# Patient Record
Sex: Female | Born: 1990 | Race: White | Hispanic: No | Marital: Single | Smoking: Current every day smoker
Health system: Southern US, Community
[De-identification: ages and names within clinical notes are randomized; demographics above are authoritative.]

## PROBLEM LIST (undated history)

## (undated) ENCOUNTER — Inpatient Hospital Stay (HOSPITAL_COMMUNITY): Payer: Self-pay

## (undated) DIAGNOSIS — IMO0002 Reserved for concepts with insufficient information to code with codable children: Secondary | ICD-10-CM

## (undated) DIAGNOSIS — B1920 Unspecified viral hepatitis C without hepatic coma: Secondary | ICD-10-CM

## (undated) DIAGNOSIS — M3130 Wegener's granulomatosis without renal involvement: Secondary | ICD-10-CM

## (undated) HISTORY — DX: Reserved for concepts with insufficient information to code with codable children: IMO0002

## (undated) HISTORY — PX: APPENDECTOMY: SHX54

## (undated) HISTORY — PX: LUNG BIOPSY: SHX232

## (undated) HISTORY — PX: LEEP: SHX91

---

## 2010-09-01 ENCOUNTER — Ambulatory Visit (HOSPITAL_COMMUNITY)
Admission: RE | Admit: 2010-09-01 | Discharge: 2010-09-01 | Payer: Self-pay | Source: Home / Self Care | Attending: Obstetrics and Gynecology | Admitting: Obstetrics and Gynecology

## 2010-09-07 ENCOUNTER — Ambulatory Visit (HOSPITAL_COMMUNITY)
Admission: RE | Admit: 2010-09-07 | Discharge: 2010-09-07 | Payer: Self-pay | Source: Home / Self Care | Attending: Obstetrics and Gynecology | Admitting: Obstetrics and Gynecology

## 2010-10-06 DEATH — deceased

## 2010-11-15 LAB — CBC
Hemoglobin: 12.4 g/dL (ref 12.0–15.0)
MCHC: 33.8 g/dL (ref 30.0–36.0)
Platelets: 241 10*3/uL (ref 150–400)
RDW: 12.5 % (ref 11.5–15.5)

## 2010-11-15 LAB — ABO/RH: ABO/RH(D): A POS

## 2011-09-06 NOTE — L&D Delivery Note (Signed)
Delivery Note She progressed to complete and pushed well.  At 8:40 PM a viable female was delivered via Vaginal, Spontaneous Delivery (Presentation: Right Occiput Anterior).  APGAR: 9, 9; weight pending.   Placenta status: Intact, Spontaneous.  Cord:  with the following complications: None.   Anesthesia: Epidural  Episiotomy: None Lacerations: Bilateral Labial Suture Repair: none Est. Blood Loss (mL): 300  Mom to postpartum.  Baby to stay with mom.  Adeeb Konecny D 05/15/2012, 8:57 PM

## 2011-10-04 IMAGING — US US OB COMP LESS 14 WK
1 series · 14 of 28 positions shown · non-contrast
Comparison: none

[Series 1: us ob comp less 14 wks · 14 of 68 slices shown]
[im 3/68]
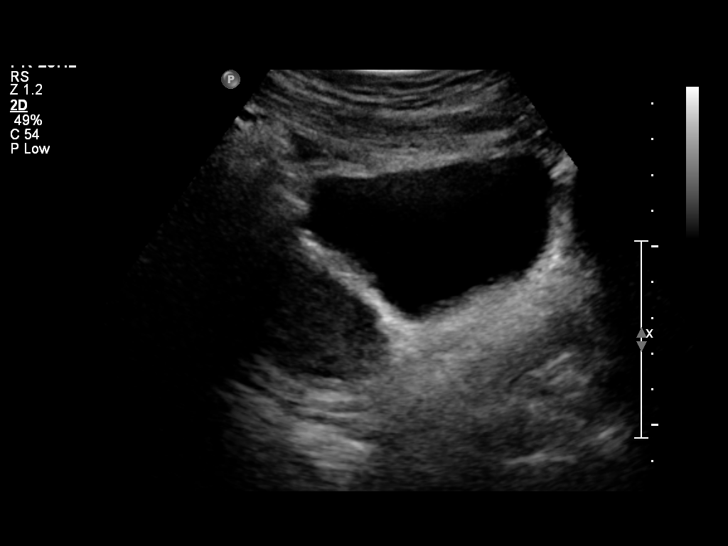
[im 8/68]
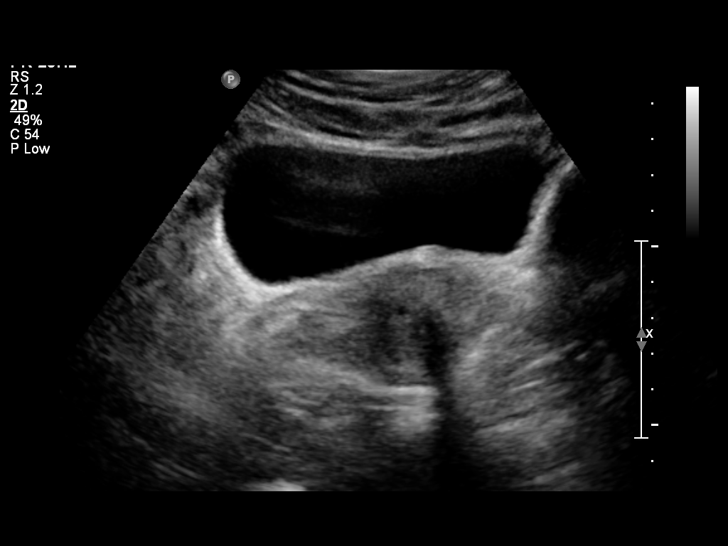
[im 13/68]
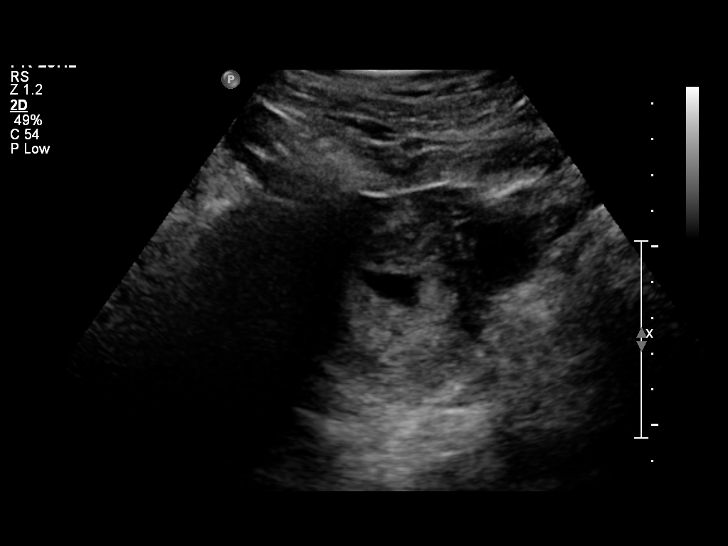
[im 18/68]
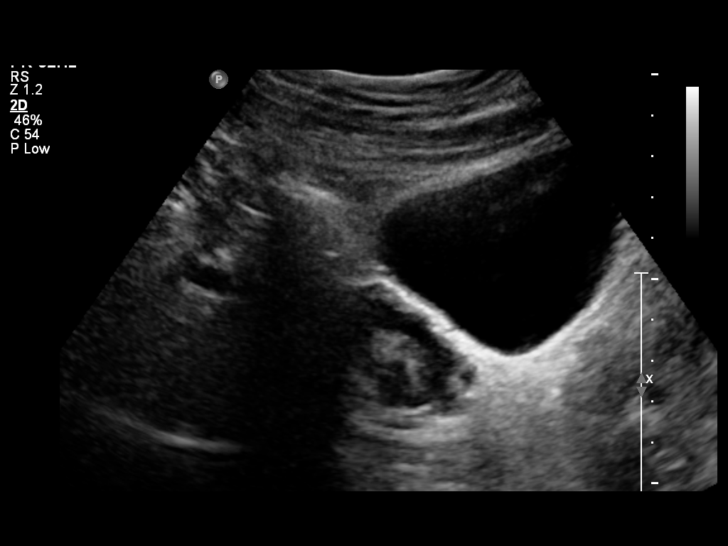
[im 23/68]
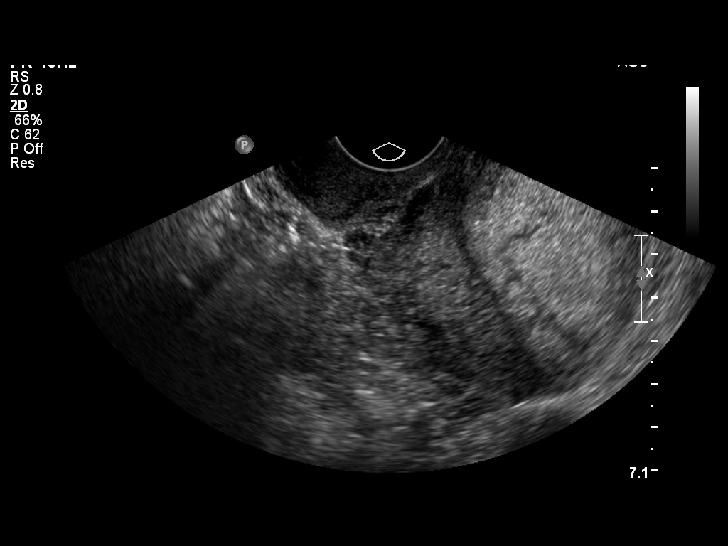
[im 28/68]
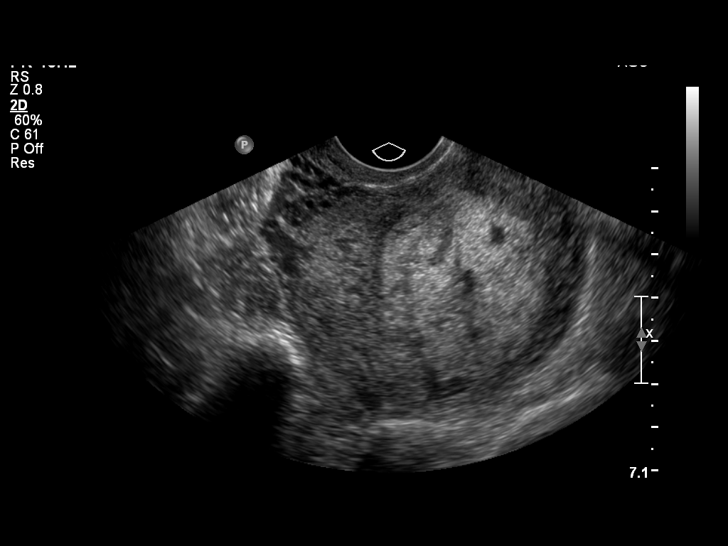
[im 33/68]
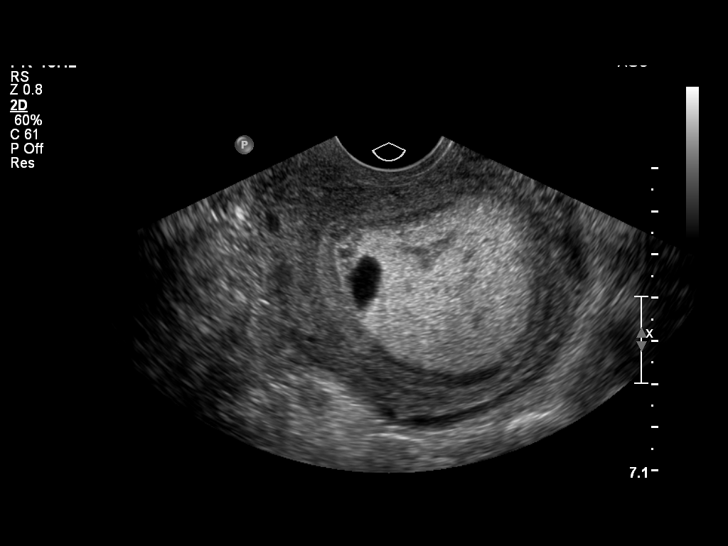
[im 38/68]
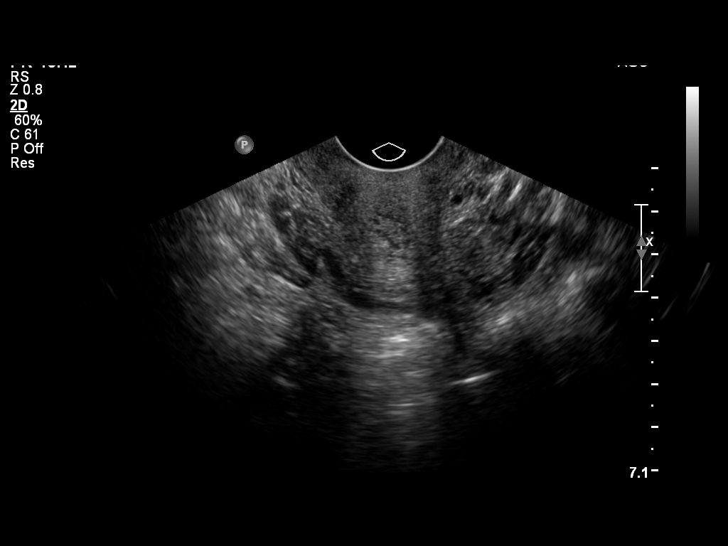
[im 43/68]
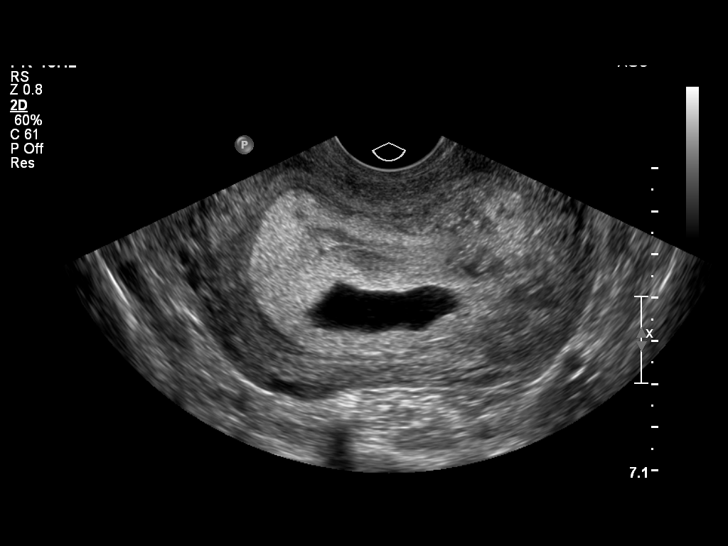
[im 48/68]
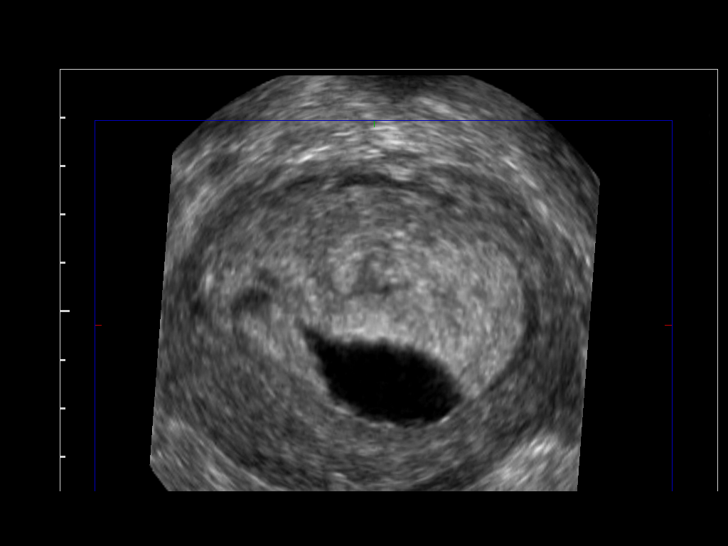
[im 53/68]
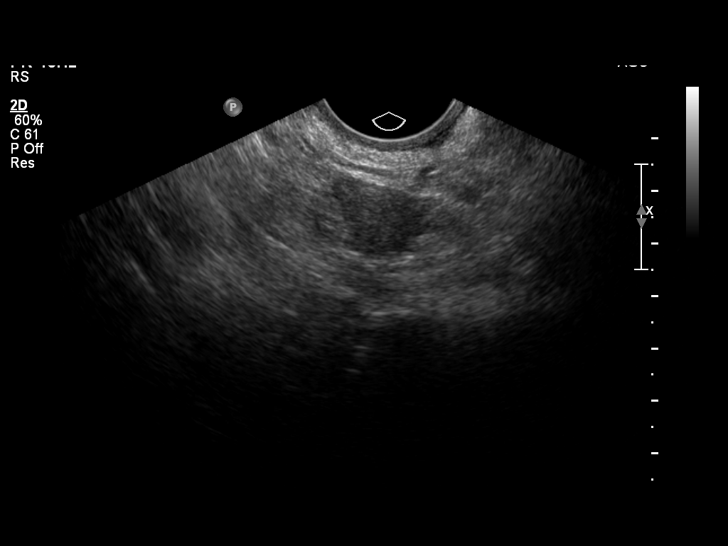
[im 58/68]
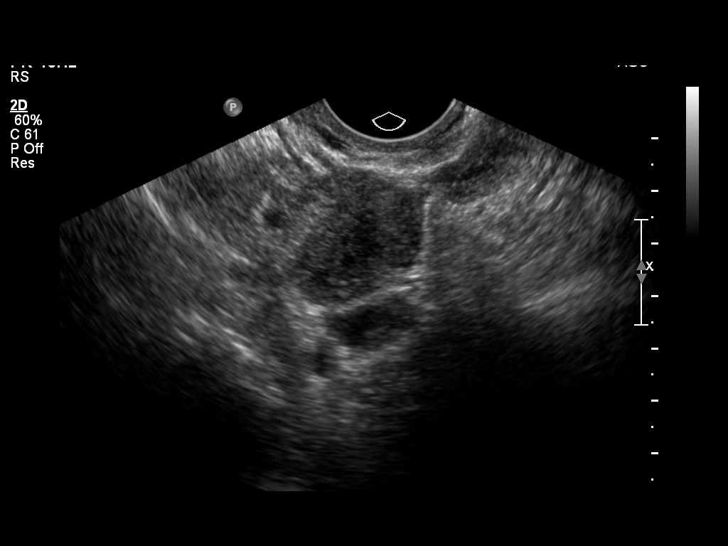
[im 63/68]
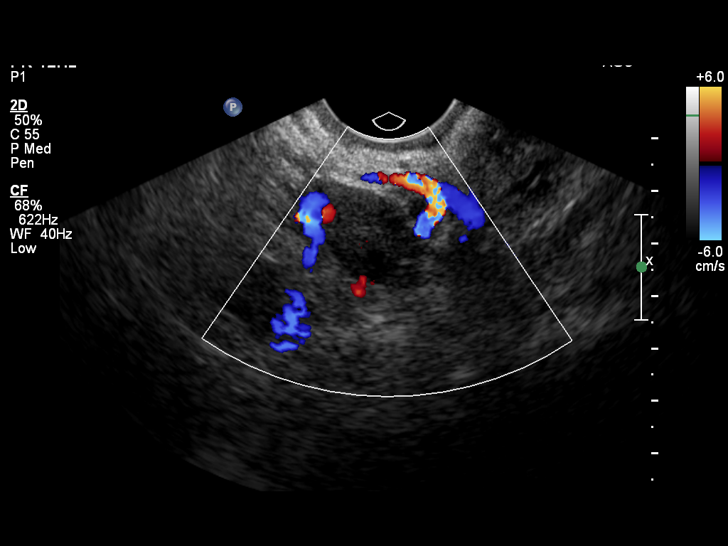
[im 68/68]
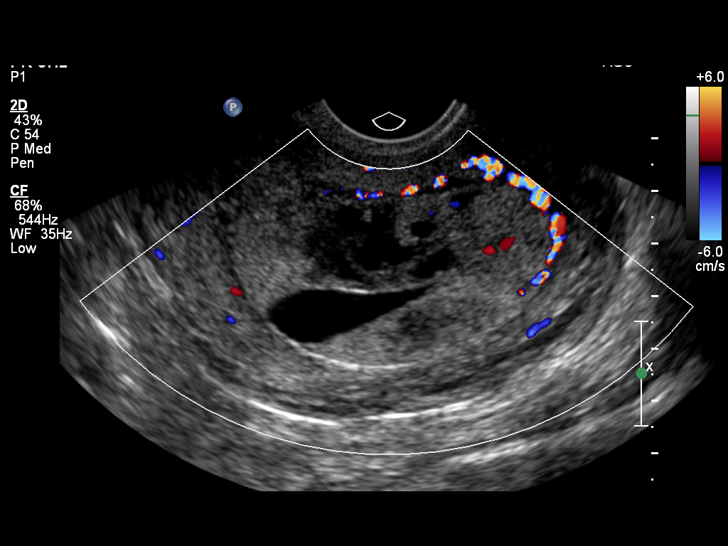

[14 of 28 positions shown; findings below may reference images not displayed]

OBSTETRICS REPORT
                      (Signed Final 09/01/2010 [DATE])

 Order#:         _O,_O
Procedures

 US OB COMP LESS 14 WKS                                76801.0
 US OB TRANSVAGINAL MODIFY                             76817.1
Indications

 Assess viability
Fetal Evaluation

 Preg. Location:    Intrauterine
 Gest. Sac:         Irregular
 Yolk Sac:          Not visualized
 Fetal Pole:        Not visualized
 Cardiac Activity:  No embryo visualized
Cervix Uterus Adnexa

 Uterus:       Retroverted.
 Cul De Sac:   No free fluid seen.

 Left Ovary:    Within normal limits.
 Right Ovary:   Within normal limits.
 Adnexa:     No abnormality visualized.
Impression

 Empty, irregular intrauterine GS, consistent with blighted
 ovum.  There is a prominent amount of acute and subacute
 subchorionic hemorrhage.  Molar pregnancy could have a
 similar appearance but is felt less likely; please correlate with
 Bhcg levels, however.

 questions or concerns.

## 2011-11-15 ENCOUNTER — Other Ambulatory Visit: Payer: Self-pay

## 2011-11-15 LAB — OB RESULTS CONSOLE ABO/RH

## 2011-11-15 LAB — OB RESULTS CONSOLE RUBELLA ANTIBODY, IGM: Rubella: UNDETERMINED

## 2011-11-15 LAB — OB RESULTS CONSOLE GC/CHLAMYDIA: Gonorrhea: NEGATIVE

## 2011-11-21 ENCOUNTER — Other Ambulatory Visit (HOSPITAL_COMMUNITY): Payer: Self-pay | Admitting: Obstetrics and Gynecology

## 2011-11-21 DIAGNOSIS — R772 Abnormality of alphafetoprotein: Secondary | ICD-10-CM

## 2011-11-21 DIAGNOSIS — Z3689 Encounter for other specified antenatal screening: Secondary | ICD-10-CM

## 2011-11-21 DIAGNOSIS — Z8679 Personal history of other diseases of the circulatory system: Secondary | ICD-10-CM

## 2011-11-23 ENCOUNTER — Encounter (HOSPITAL_COMMUNITY): Payer: Self-pay | Admitting: Obstetrics and Gynecology

## 2011-12-02 ENCOUNTER — Encounter (HOSPITAL_COMMUNITY): Payer: Self-pay

## 2011-12-02 ENCOUNTER — Ambulatory Visit (HOSPITAL_COMMUNITY)
Admission: RE | Admit: 2011-12-02 | Discharge: 2011-12-02 | Disposition: A | Discharge status: Home / Self Care | Payer: Medicaid Other | Source: Ambulatory Visit | Attending: Obstetrics and Gynecology | Admitting: Obstetrics and Gynecology

## 2011-12-02 VITALS — BP 114/64 | HR 89 | Wt 198.0 lb

## 2011-12-02 DIAGNOSIS — O358XX0 Maternal care for other (suspected) fetal abnormality and damage, not applicable or unspecified: Secondary | ICD-10-CM | POA: Insufficient documentation

## 2011-12-02 DIAGNOSIS — Z363 Encounter for antenatal screening for malformations: Secondary | ICD-10-CM | POA: Insufficient documentation

## 2011-12-02 DIAGNOSIS — R772 Abnormality of alphafetoprotein: Secondary | ICD-10-CM

## 2011-12-02 DIAGNOSIS — Z8679 Personal history of other diseases of the circulatory system: Secondary | ICD-10-CM

## 2011-12-02 DIAGNOSIS — Z3689 Encounter for other specified antenatal screening: Secondary | ICD-10-CM

## 2011-12-02 DIAGNOSIS — Z1389 Encounter for screening for other disorder: Secondary | ICD-10-CM | POA: Insufficient documentation

## 2011-12-02 NOTE — Progress Notes (Signed)
Genetic Counseling  High-Risk Gestation Note  Appointment Date:  12/02/2011 Referred By: Lavina Hamman, MD Date of Birth:  2004/01/28 Attending: Rica Koyanagi, MD    Kathy Payne and her partner, Mr. Lowella Petties, were seen for genetic counseling because of an increased risk for fetal Down syndrome based on Quad screening performed through [facility]. The patient was also accompanied by her mother.   They were counseled regarding the Quad screen result and the associated 1 in 129 risk for fetal Down syndrome.  We reviewed chromosomes, nondisjunction, and the common features and variable prognosis of Down syndrome.  In addition, we reviewed the screen negative risks for trisomy 18 and ONTDs (1 in 10,000).  We also discussed other explanations for a screen positive result including: a gestational dating error, differences in maternal metabolism, and normal variation.  We reviewed other available screening and diagnostic options including detailed ultrasound and amniocentesis.  We discussed the risks, limitations, and benefits of each. We discussed another type of screening test, noninvasive prenatal testing (NIPT), which utilizes cell free fetal DNA found in the maternal circulation. This test is not diagnostic for chromosome conditions, but can provide information regarding the presence or absence of extra fetal DNA for chromosomes 13, 18 and 21. Thus, it would not identify or rule out all fetal aneuploidy. The reported detection rate is greater than 99% for Trisomy 21, greater than 97% for Trisomy 18, and is approximately 80% (8 out of 10) for Trisomy 13. The false positive rate is reported to be less than 1% for any of these conditions.  After thoughtful consideration of these options, Kathy Payne elected to have ultrasound, but declined amniocentesis and cell free fetal DNA testing.  She understands that ultrasound cannot rule out all birth defects or genetic syndromes.  The patient was  advised of this limitation and states she still does not want diagnostic testing at this time.  However, they were counseled that 50-80% of fetuses with Down syndrome, when well visualized, have detectable anomalies or soft markers by ultrasound.  A complete ultrasound was performed today.  The ultrasound report will be sent under separate cover.   Kathy Payne was provided with written information regarding cystic fibrosis (CF) including the carrier frequency and incidence in the Caucasian population, the availability of carrier testing and prenatal diagnosis if indicated.  In addition, we discussed that CF is routinely screened for as part of the [place] newborn screening panel.  She declined testing today.   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 Luberta Robertson, pulmonologist at [facility]. We briefly discussed that the etiology of this condition is not currently known, but that it is not suspected to have a genetic cause.   Additionally, the father of the pregnancy's mother has multiple sclerosis. MS is generally thought to be a multifactorial condition, meaning that multiple genes and environmental factors are involved in its onset. The chance for recurrence for first degree relatives of an affected individual is estimated to be approximately 2-3% from birth and approximately 1% after age 57 years. Recurrence risk would be lower for a second degree relative. Without further information regarding the provided family history, an accurate genetic risk cannot be calculated. Further genetic counseling is warranted if more information is obtained.  Kathy Payne denied exposure to environmental toxins or chemical agents.  She denied the use of alcohol or street drugs. She reported smoking approximately a half pack of cigarettes per day. The  associations of smoking in pregnancy were reviewed and cessation encouraged.  She denied significant viral illnesses during the course of her pregnancy. Her medical and surgical histories were contributory for Wegener's Granulomatosis.   I counseled Kathy Payne for approximately 35 minutes regarding the above risks and available options.     Quinn Plowman, MS,  Certified Genetic Counselor  12/02/2011

## 2011-12-02 NOTE — Progress Notes (Signed)
Obstetric ultrasound performed today.    IUP at 17 weeks 4 days Fetal measurement consistent with dating by early ultrasound Normal fetal anatomic survey (some limited cardiac and spine views) No markers of aneuploidy identified Normal amniotic fluid volume  Ultrasound findings were discussed and the patient received genetic counseling.  She declined further testing for fetal aneuploidy.   Please see full report in [facility].

## 2011-12-02 NOTE — Progress Notes (Signed)
Encounter addended by: Marlana Latus, RN on: 12/02/2011  4:33 PM<BR>     Documentation filed: Chief Complaint Section, Episodes

## 2012-01-19 ENCOUNTER — Ambulatory Visit: Payer: Medicaid Other | Admitting: Gastroenterology

## 2012-01-20 ENCOUNTER — Ambulatory Visit (HOSPITAL_COMMUNITY)
Admission: RE | Admit: 2012-01-20 | Discharge: 2012-01-20 | Disposition: A | Discharge status: Home / Self Care | Payer: Medicaid Other | Source: Ambulatory Visit | Attending: Obstetrics and Gynecology | Admitting: Obstetrics and Gynecology

## 2012-01-20 DIAGNOSIS — Z3689 Encounter for other specified antenatal screening: Secondary | ICD-10-CM | POA: Insufficient documentation

## 2012-01-20 DIAGNOSIS — O3510X0 Maternal care for (suspected) chromosomal abnormality in fetus, unspecified, not applicable or unspecified: Secondary | ICD-10-CM | POA: Insufficient documentation

## 2012-01-20 DIAGNOSIS — O351XX0 Maternal care for (suspected) chromosomal abnormality in fetus, not applicable or unspecified: Secondary | ICD-10-CM | POA: Insufficient documentation

## 2012-01-20 DIAGNOSIS — R772 Abnormality of alphafetoprotein: Secondary | ICD-10-CM

## 2012-01-20 DIAGNOSIS — Z8679 Personal history of other diseases of the circulatory system: Secondary | ICD-10-CM

## 2012-01-26 ENCOUNTER — Ambulatory Visit: Payer: Medicaid Other | Admitting: Gastroenterology

## 2012-03-22 ENCOUNTER — Inpatient Hospital Stay (HOSPITAL_COMMUNITY)
Admission: AD | Admit: 2012-03-22 | Discharge: 2012-03-22 | Disposition: A | Discharge status: Home / Self Care | Payer: Medicaid Other | Source: Ambulatory Visit | Attending: Obstetrics and Gynecology | Admitting: Obstetrics and Gynecology

## 2012-03-22 ENCOUNTER — Encounter (HOSPITAL_COMMUNITY): Payer: Self-pay | Admitting: *Deleted

## 2012-03-22 DIAGNOSIS — O288 Other abnormal findings on antenatal screening of mother: Secondary | ICD-10-CM

## 2012-03-22 DIAGNOSIS — O99891 Other specified diseases and conditions complicating pregnancy: Secondary | ICD-10-CM | POA: Insufficient documentation

## 2012-03-22 DIAGNOSIS — O289 Unspecified abnormal findings on antenatal screening of mother: Secondary | ICD-10-CM | POA: Insufficient documentation

## 2012-03-22 HISTORY — DX: Wegener's granulomatosis without renal involvement: M31.30

## 2012-03-22 NOTE — MAU Provider Note (Signed)
Chief Complaint:  non-reactive trcing    First Provider Initiated Contact with Patient 03/22/12 1837      HPI  Kathy Payne is a 21 y.o. G2P0010 at [redacted]w[redacted]d sent to MAU from the office for extended monitoring. She has an NST for decreased FM. NST was non-reactive.  Denies contractions, leakage of fluid or vaginal bleeding. Good fetal movement. States "not feeling well today". Reports normal PO intake.  Past Medical History: Past Medical History  Diagnosis Date  . Wegener's granulomatosis     Past Surgical History: Past Surgical History  Procedure Date  . Appendectomy     Family History: History reviewed. No pertinent family history.  Social History: History  Substance Use Topics  . Smoking status: Current Everyday Smoker -- 0.5 packs/day    Types: Cigarettes  . Smokeless tobacco: Not on file  . Alcohol Use: No    Allergies: No Known Allergies  Meds:  Prescriptions prior to admission  Medication Sig Dispense Refill  . Prenatal Vit-Fe Fumarate-FA (PRENATAL MULTIVITAMIN) TABS Take 1 tablet by mouth daily.          Physical Exam  Blood pressure 99/58, pulse 80, temperature 97.5 F (36.4 C), temperature source Oral, resp. rate 18, last menstrual period 08/01/2011. GENERAL: Well-developed, well-nourished female in no acute distress.  HEENT: normocephalic, good dentition HEART: normal rate RESP: normal effort ABDOMEN: Soft, nontender, gravid appropriate for gestational age EXTREMITIES: Nontender, no edema NEURO: alert and oriented  SPECULUM EXAM: Deferred  FHT:  Baseline 115-120 , moderate variability, accelerations present, no decelerations Contractions: UI   Labs: NA  Imaging:  NA  Assessment: 1. Non-reactive NST (non-stress test), now reactive   Plan: D/C home per consult w/ Dr. Senaida Ores PTL precautions and FKCs Follow-up Information    Follow up with Oliver Pila, MD.   Contact information:   [address], [address] [place]  [place] [address] [phone]       Follow up with [facility]. (As needed if symptoms worsen)    Contact information:   [address] [place] [address] [phone]        Medication List  As of 03/28/2012  5:56 AM   CONTINUE taking these medications         prenatal multivitamin Tabs           Dorathy Kinsman 7/18/20136:43 PM

## 2012-03-22 NOTE — MAU Note (Signed)
reassurring but not reactive in office.  Sent for extended monitoring.

## 2012-03-22 NOTE — MAU Note (Signed)
Sent from office for NRNST.

## 2012-03-29 ENCOUNTER — Ambulatory Visit (INDEPENDENT_AMBULATORY_CARE_PROVIDER_SITE_OTHER): Payer: Medicaid Other | Admitting: Gastroenterology

## 2012-03-29 DIAGNOSIS — B182 Chronic viral hepatitis C: Secondary | ICD-10-CM

## 2012-03-29 LAB — CBC WITH DIFFERENTIAL/PLATELET
Eosinophils Absolute: 0.1 10*3/uL (ref 0.0–0.7)
Eosinophils Relative: 1 % (ref 0–5)
HCT: 32.3 % — ABNORMAL LOW (ref 36.0–46.0)
Lymphocytes Relative: 22 % (ref 12–46)
Lymphs Abs: 2.4 10*3/uL (ref 0.7–4.0)
Monocytes Absolute: 0.5 10*3/uL (ref 0.1–1.0)
RDW: 13.3 % (ref 11.5–15.5)
WBC: 11.1 10*3/uL — ABNORMAL HIGH (ref 4.0–10.5)

## 2012-03-29 LAB — PROTIME-INR: Prothrombin Time: 13.5 seconds (ref 11.6–15.2)

## 2012-03-30 LAB — COMPLETE METABOLIC PANEL WITH GFR
Albumin: 3.7 g/dL (ref 3.5–5.2)
Alkaline Phosphatase: 84 U/L (ref 39–117)
CO2: 20 mEq/L (ref 19–32)
Calcium: 8.7 mg/dL (ref 8.4–10.5)
Potassium: 4 mEq/L (ref 3.5–5.3)
Sodium: 137 mEq/L (ref 135–145)

## 2012-03-30 LAB — HEPATITIS A ANTIBODY, TOTAL: Hep A Total Ab: NEGATIVE

## 2012-03-30 LAB — IBC PANEL
%SAT: 19 % — ABNORMAL LOW (ref 20–55)
TIBC: 442 ug/dL (ref 250–470)

## 2012-03-30 LAB — ANA: Anti Nuclear Antibody(ANA): NEGATIVE

## 2012-03-30 LAB — HEPATITIS B CORE ANTIBODY, TOTAL: Hep B Core Total Ab: NEGATIVE

## 2012-03-30 LAB — FERRITIN: Ferritin: 55 ng/mL (ref 10–291)

## 2012-04-04 LAB — OB RESULTS CONSOLE GBS: GBS: POSITIVE

## 2012-04-05 NOTE — Progress Notes (Addendum)
Kathy Payne, Kathy Payne    MR#:  [id number]      DATE:  03/29/2012  DOB:  1990/09/11    cc:     referring physician:  Lavina Hamman, MD, [facility], [address] [place], [address], [place], [place] [address], fax [phone].  primary care physician:  None.  consulting physician:  Delaney Meigs. Luberta Robertson, MD, The Section of Pulmonary Critical Care Allergy and Immunologic Diseases, the Department of Medicine, [facility], [facility], [place], [place] [address].   REASON FOR REFERRAL: Positive hepatitis C antibody.   HISTORY: The patient is a 21 year old woman who I have been asked to see in consultation by Dr. Jackelyn Knife regarding a positive hepatitis C antibody.   According to the patient and her mother, who accompanied her today, there was no known history of hepatitis C until she was undergoing prenatal care at [facility]. She is currently [redacted] weeks pregnant with a date of 05/07/2012. On 11/15/2011 as part of routine testing she was hepatitis B surface antigen negative and hepatitis C antibody positive. Viral load was done 3,725,810 international units per mL or 6.571 international units per mL. She was not genotyped.   There are currently no symptoms referable to her history of hepatitis C nor symptoms to suggest cryoglobulin mediated or decompensated liver disease.   With respect to risk factors for liver disease, she reports only occasional use of alcohol in the past prior to pregnancy with no DWIs. 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 She has now been drug free for the last 10 months. She has a history of tattoos but no history of unsterile body piercing. In terms of transfusions, there is no history of blood transfusion prior to 1992. There is no family history of liver disease. I think she may have received hepatitis B  vaccination and possibly hepatitis A vaccine.   PAST MEDICAL HISTORY: Significant for Wegener's granulomatosis. She is followed by Dr. Luberta Robertson at [facility] for this. This was diagnosed by a lung biopsy in 2008 at [facility]. She has been treated with prednisone a combination of methotrexate, Cytoxan and plasmapheresis. She has been in remission off therapy since 2010. There was some indication of nasal involvement in the past as well as her lung involvement. Otherwise she has no significant past medical history including no history of diabetes, dyslipidemia, coronary artery disease, hypertension, or dysthyroidism.   PAST SURGICAL HISTORY: Lung biopsy and chest tube placement 2008 at [facility] and appendectomy.   PAST PSYCHIATRIC HISTORY: Denies.   CURRENT MEDICATIONS: Multivitamin only.   ALLERGIES: Denies.   HABITS: Smoking half pack of cigarettes per day. Alcohol as above.   FAMILY HISTORY: As above.   SOCIAL HISTORY: Single.  She is now pregnant with her first child at 34 weeks. She is currently living with a grandmother. The plan is to live with her mother after she delivers in [place], [place].   REVIEW OF SYSTEMS: All 10 systems reviewed today with the patient and they are negative other than which is mentioned above. CES-D was 5.   PHYSICAL EXAMINATION: Constitutional:  Well-appearing. Vital signs: Height 66 inches, weight 193 pounds, blood pressure 125/83, pulse 112, temperature of 97.8 Fahrenheit. Ears, nose, mouth and throat: Unremarkable oropharynx. No thyromegaly or neck masses. Chest:  Resonant to percussion. Clear to auscultation. Cardiovascular: Heart sounds normal S1, S2 without murmurs  or rubs. There is no peripheral edema. Abdominal exam: Normal bowel sounds. No masses or tenderness. There is a gravid uterus. I could not appreciate liver edge or spleen tip. Lymphatics: No cervical or inguinal lymphadenopathy. Central nervous system:  No asterixis or  focal neurologic findings. Dermatologic: Anicteric. No palmar erythema. Eyes: Anicteric sclera. Pupils were equal and reactive to light.   previous LABORATORIES: 11/15/2011 her platelet count was 249, hemoglobin 11, white count 6.4. Hepatitis B surface antigen was negative as previously mentioned. Her blood group was A positive. Her HIV was negative.  The HCV antibody was positive with a viral load of 3,725,810 international units per mL or 6.571 log international units per mL.   ASSESSMENT: The patient is a 21 year old woman with a history of a positive HCV RNA, genotype unknown, naive to treatment.   In terms of hepatitis C, there are several reasons not to treat her currently and possibly not treat her with interferon based therapies. First is that she is pregnant and ribavirin is a teratogen.  Second, interferon based therapies may exacerbate underlying autoimmune conditions, so I am concerned about the possibility of Wegener's granulomatosis flaring up with the use of pegylated interferon.  There are better therapies including non-interferon therapies that are expected by 2015 and certainly at her age it is unlikely that she would have cirrhosis from hepatitis C infection giving [place] time to wait for therapies with less side effects and better response rates.   In terms of the pregnancy, there is nothing that can be done to the hepatitis C that would modify the risk of transmission of hepatitis C to the child. Two well designed studies in the Journals and Annals of Internal Medicine suggest that the incidence of transmission is 5%, and transmission is much for efficient in the setting of HIV.   In my discussion today with the patient and her mother, who accompanied her, we discussed the diagnosis of hepatitis C and its significance. We discussed genotyping her. We discussed the role of a liver biopsy in genotype 1 versus waiting for the availability of fibroscan.  We discussed treatment with interferon  based therapies and waiting for non-interferon based therapies as well. I explained the rationale for waiting for non-interferon based therapies for her. We discussed the risk of contagion.   PLAN:  1. Standard labs.  2. Test for hepatitis A and B immunity.  3. Genotype.  4. Literature on hepatitis C given.  5. I will see her again in approximately 6-12 months' time in followup.  6. I will fax off a request for information from Dr. Luberta Robertson at [facility] regarding records regarding her treatment.               Brooke Dare, MD   ADDENDUM Genotype 3a   Hepatitis A and B nave.  04/26/12.  The only records I received from [facility] was an ER visit 10/10/09, for a URI.  [id number] .[id number]  D:  Thu Jul 25 20:37:59 2013 ; T:  Fri Jul 26 15:31:07 2013  Job #:  [id number]

## 2012-04-11 ENCOUNTER — Ambulatory Visit (HOSPITAL_COMMUNITY)
Admission: RE | Admit: 2012-04-11 | Discharge: 2012-04-11 | Disposition: A | Discharge status: Home / Self Care | Payer: Medicaid Other | Source: Ambulatory Visit | Attending: Obstetrics and Gynecology | Admitting: Obstetrics and Gynecology

## 2012-04-11 ENCOUNTER — Other Ambulatory Visit (HOSPITAL_COMMUNITY): Payer: Self-pay | Admitting: Obstetrics and Gynecology

## 2012-04-11 VITALS — BP 119/70 | HR 108 | Wt 203.5 lb

## 2012-04-11 DIAGNOSIS — O26849 Uterine size-date discrepancy, unspecified trimester: Secondary | ICD-10-CM

## 2012-04-11 DIAGNOSIS — O3510X0 Maternal care for (suspected) chromosomal abnormality in fetus, unspecified, not applicable or unspecified: Secondary | ICD-10-CM | POA: Insufficient documentation

## 2012-04-11 DIAGNOSIS — O365990 Maternal care for other known or suspected poor fetal growth, unspecified trimester, not applicable or unspecified: Secondary | ICD-10-CM | POA: Insufficient documentation

## 2012-04-11 DIAGNOSIS — O351XX0 Maternal care for (suspected) chromosomal abnormality in fetus, not applicable or unspecified: Secondary | ICD-10-CM | POA: Insufficient documentation

## 2012-04-11 NOTE — Progress Notes (Signed)
Patient seen today  for follow up ultrasound.  See full report in [facility].  Alpha Gula, MD  Single IUP at 36 2/7 weeks Overall fetal growth is appropriate (19th %tile).  The [facility] today measures at the 5th %tile. The long bones are somewhat shortened (all < 10th %tile) but appear morphologically normal Normal umbilical artery Doppler studies Normal amniotic fluid volume  Recommend 2x weekly NSTs, and follow up ultrasounds as clinically indicated

## 2012-04-12 ENCOUNTER — Other Ambulatory Visit: Payer: Self-pay

## 2012-05-07 ENCOUNTER — Inpatient Hospital Stay (HOSPITAL_COMMUNITY)
Admission: AD | Admit: 2012-05-07 | Discharge: 2012-05-07 | Disposition: A | Discharge status: Home / Self Care | Payer: Medicaid Other | Source: Ambulatory Visit | Attending: Obstetrics and Gynecology | Admitting: Obstetrics and Gynecology

## 2012-05-07 DIAGNOSIS — O368390 Maternal care for abnormalities of the fetal heart rate or rhythm, unspecified trimester, not applicable or unspecified: Secondary | ICD-10-CM | POA: Insufficient documentation

## 2012-05-07 NOTE — MAU Note (Signed)
Spoke with CN Heather in BS. Will triage in 173.

## 2012-05-08 ENCOUNTER — Telehealth (HOSPITAL_COMMUNITY): Payer: Self-pay | Admitting: *Deleted

## 2012-05-08 ENCOUNTER — Encounter (HOSPITAL_COMMUNITY): Payer: Self-pay | Admitting: *Deleted

## 2012-05-08 NOTE — Telephone Encounter (Signed)
Preadmission screen  

## 2012-05-14 ENCOUNTER — Encounter (HOSPITAL_COMMUNITY): Payer: Self-pay

## 2012-05-14 ENCOUNTER — Inpatient Hospital Stay (HOSPITAL_COMMUNITY)
Admission: RE | Admit: 2012-05-14 | Discharge: 2012-05-17 | DRG: 775 | Disposition: A | Discharge status: Home / Self Care | Payer: Medicaid Other | Source: Ambulatory Visit | Attending: Obstetrics and Gynecology | Admitting: Obstetrics and Gynecology

## 2012-05-14 DIAGNOSIS — B1920 Unspecified viral hepatitis C without hepatic coma: Secondary | ICD-10-CM | POA: Diagnosis present

## 2012-05-14 DIAGNOSIS — O365990 Maternal care for other known or suspected poor fetal growth, unspecified trimester, not applicable or unspecified: Secondary | ICD-10-CM | POA: Diagnosis present

## 2012-05-14 DIAGNOSIS — Z3490 Encounter for supervision of normal pregnancy, unspecified, unspecified trimester: Secondary | ICD-10-CM

## 2012-05-14 DIAGNOSIS — O99892 Other specified diseases and conditions complicating childbirth: Secondary | ICD-10-CM | POA: Diagnosis present

## 2012-05-14 DIAGNOSIS — Z22330 Carrier of Group B streptococcus: Secondary | ICD-10-CM

## 2012-05-14 DIAGNOSIS — O26619 Liver and biliary tract disorders in pregnancy, unspecified trimester: Principal | ICD-10-CM | POA: Diagnosis present

## 2012-05-14 HISTORY — DX: Unspecified viral hepatitis C without hepatic coma: B19.20

## 2012-05-14 LAB — CBC
Hemoglobin: 11.1 g/dL — ABNORMAL LOW (ref 12.0–15.0)
MCH: 33.2 pg (ref 26.0–34.0)
MCHC: 35.4 g/dL (ref 30.0–36.0)
MCV: 94 fL (ref 78.0–100.0)
RBC: 3.34 MIL/uL — ABNORMAL LOW (ref 3.87–5.11)

## 2012-05-14 MED ORDER — IBUPROFEN 600 MG PO TABS
600.0000 mg | ORAL_TABLET | Freq: Four times a day (QID) | ORAL | Status: DC | PRN
  Administered 2012-05-15: 600 mg via ORAL
  Filled 2012-05-14: qty 1

## 2012-05-14 MED ORDER — CITRIC ACID-SODIUM CITRATE 334-500 MG/5ML PO SOLN
30.0000 mL | ORAL | Status: DC | PRN
  Administered 2012-05-15: 30 mL via ORAL
  Filled 2012-05-14: qty 15

## 2012-05-14 MED ORDER — LACTATED RINGERS IV SOLN
500.0000 mL | INTRAVENOUS | Status: DC | PRN
  Administered 2012-05-15: 500 mL via INTRAVENOUS

## 2012-05-14 MED ORDER — ONDANSETRON HCL 4 MG/2ML IJ SOLN: 4.0000 mg | Freq: Four times a day (QID) | INTRAMUSCULAR | Status: DC | PRN

## 2012-05-14 MED ORDER — TERBUTALINE SULFATE 1 MG/ML IJ SOLN: 0.2500 mg | Freq: Once | INTRAMUSCULAR | Status: AC | PRN

## 2012-05-14 MED ORDER — ZOLPIDEM TARTRATE 5 MG PO TABS: 5.0000 mg | ORAL_TABLET | Freq: Every evening | ORAL | Status: DC | PRN

## 2012-05-14 MED ORDER — PENICILLIN G POTASSIUM 5000000 UNITS IJ SOLR
5.0000 10*6.[IU] | Freq: Once | INTRAVENOUS | Status: AC
  Administered 2012-05-14: 5 10*6.[IU] via INTRAVENOUS
  Filled 2012-05-14: qty 5

## 2012-05-14 MED ORDER — LIDOCAINE HCL (PF) 1 % IJ SOLN
30.0000 mL | INTRAMUSCULAR | Status: DC | PRN
  Filled 2012-05-14: qty 30

## 2012-05-14 MED ORDER — OXYTOCIN BOLUS FROM INFUSION
500.0000 mL | Freq: Once | INTRAVENOUS | Status: AC
  Administered 2012-05-15: 500 mL via INTRAVENOUS
  Filled 2012-05-14: qty 500

## 2012-05-14 MED ORDER — ACETAMINOPHEN 325 MG PO TABS: 650.0000 mg | ORAL_TABLET | ORAL | Status: DC | PRN

## 2012-05-14 MED ORDER — MISOPROSTOL 25 MCG QUARTER TABLET
25.0000 ug | ORAL_TABLET | ORAL | Status: DC | PRN
  Administered 2012-05-14: 25 ug via VAGINAL
  Filled 2012-05-14 (×2): qty 0.25

## 2012-05-14 MED ORDER — LACTATED RINGERS IV SOLN
INTRAVENOUS | Status: DC
  Administered 2012-05-14: 21:00:00 via INTRAVENOUS
  Administered 2012-05-15: 125 mL via INTRAVENOUS
  Administered 2012-05-15: 14:00:00 via INTRAVENOUS

## 2012-05-14 MED ORDER — OXYTOCIN 40 UNITS IN LACTATED RINGERS INFUSION - SIMPLE MED
62.5000 mL/h | Freq: Once | INTRAVENOUS | Status: AC
  Administered 2012-05-15: 62.5 mL/h via INTRAVENOUS

## 2012-05-14 MED ORDER — PENICILLIN G POTASSIUM 5000000 UNITS IJ SOLR
2.5000 10*6.[IU] | INTRAVENOUS | Status: DC
  Administered 2012-05-15 (×5): 2.5 10*6.[IU] via INTRAVENOUS
  Filled 2012-05-14 (×8): qty 2.5

## 2012-05-14 MED ORDER — OXYCODONE-ACETAMINOPHEN 5-325 MG PO TABS: 1.0000 | ORAL_TABLET | ORAL | Status: DC | PRN

## 2012-05-15 ENCOUNTER — Encounter (HOSPITAL_COMMUNITY): Payer: Self-pay | Admitting: Anesthesiology

## 2012-05-15 ENCOUNTER — Encounter (HOSPITAL_COMMUNITY): Payer: Self-pay

## 2012-05-15 ENCOUNTER — Inpatient Hospital Stay (HOSPITAL_COMMUNITY): Payer: Medicaid Other | Admitting: Anesthesiology

## 2012-05-15 DIAGNOSIS — B1920 Unspecified viral hepatitis C without hepatic coma: Secondary | ICD-10-CM | POA: Diagnosis present

## 2012-05-15 DIAGNOSIS — Z3490 Encounter for supervision of normal pregnancy, unspecified, unspecified trimester: Secondary | ICD-10-CM

## 2012-05-15 LAB — RPR: RPR Ser Ql: NONREACTIVE

## 2012-05-15 MED ORDER — OXYTOCIN 40 UNITS IN LACTATED RINGERS INFUSION - SIMPLE MED
1.0000 m[IU]/min | INTRAVENOUS | Status: DC
  Administered 2012-05-15: 2 m[IU]/min via INTRAVENOUS
  Filled 2012-05-15: qty 1000

## 2012-05-15 MED ORDER — LIDOCAINE HCL (PF) 1 % IJ SOLN
INTRAMUSCULAR | Status: DC | PRN
  Administered 2012-05-15 (×2): 4 mL

## 2012-05-15 MED ORDER — PHENYLEPHRINE 40 MCG/ML (10ML) SYRINGE FOR IV PUSH (FOR BLOOD PRESSURE SUPPORT): 80.0000 ug | PREFILLED_SYRINGE | INTRAVENOUS | Status: DC | PRN

## 2012-05-15 MED ORDER — LACTATED RINGERS IV SOLN
500.0000 mL | Freq: Once | INTRAVENOUS | Status: AC
  Administered 2012-05-15: 500 mL via INTRAVENOUS

## 2012-05-15 MED ORDER — PHENYLEPHRINE 40 MCG/ML (10ML) SYRINGE FOR IV PUSH (FOR BLOOD PRESSURE SUPPORT)
80.0000 ug | PREFILLED_SYRINGE | INTRAVENOUS | Status: DC | PRN
  Filled 2012-05-15: qty 5

## 2012-05-15 MED ORDER — EPHEDRINE 5 MG/ML INJ: 10.0000 mg | INTRAVENOUS | Status: DC | PRN

## 2012-05-15 MED ORDER — FENTANYL 2.5 MCG/ML BUPIVACAINE 1/10 % EPIDURAL INFUSION (WH - ANES)
14.0000 mL/h | INTRAMUSCULAR | Status: DC
Start: 2012-05-15 — End: 2012-05-15
  Administered 2012-05-15: 14 mL/h via EPIDURAL
  Filled 2012-05-15 (×2): qty 60

## 2012-05-15 MED ORDER — DIPHENHYDRAMINE HCL 50 MG/ML IJ SOLN: 12.5000 mg | INTRAMUSCULAR | Status: DC | PRN

## 2012-05-15 MED ORDER — FENTANYL 2.5 MCG/ML BUPIVACAINE 1/10 % EPIDURAL INFUSION (WH - ANES)
INTRAMUSCULAR | Status: DC | PRN
  Administered 2012-05-15: 14 mL/h via EPIDURAL

## 2012-05-15 MED ORDER — BUTORPHANOL TARTRATE 1 MG/ML IJ SOLN
2.0000 mg | Freq: Once | INTRAMUSCULAR | Status: AC
  Administered 2012-05-15: 2 mg via INTRAVENOUS
  Filled 2012-05-15: qty 2

## 2012-05-15 MED ORDER — EPHEDRINE 5 MG/ML INJ
10.0000 mg | INTRAVENOUS | Status: DC | PRN
  Filled 2012-05-15: qty 4

## 2012-05-15 NOTE — Progress Notes (Signed)
Comfortable Afeb, VSS FHT- Cat II, min-mod variability, some early and some late decels, ctx q 3 min VE- 6/90/0, vtx Continue pitocin and PCN, monitor progress and FHT

## 2012-05-15 NOTE — Anesthesia Procedure Notes (Signed)
Epidural Patient location during procedure: OB Start time: 05/15/2012 1:12 PM  Staffing Anesthesiologist: Aamiyah Derrick A. Performed by: anesthesiologist   Preanesthetic Checklist Completed: patient identified, site marked, surgical consent, pre-op evaluation, timeout performed, IV checked, risks and benefits discussed and monitors and equipment checked  Epidural Patient position: sitting Prep: site prepped and draped and DuraPrep Patient monitoring: continuous pulse ox and blood pressure Approach: midline Injection technique: LOR air  Needle:  Needle type: Tuohy  Needle gauge: 17 G Needle length: 9 cm and 9 Needle insertion depth: 6 cm Catheter type: closed end flexible Catheter size: 19 Gauge Catheter at skin depth: 11 cm Test dose: negative and Other  Assessment Events: blood not aspirated, injection not painful, no injection resistance, negative IV test and no paresthesia  Additional Notes Patient identified. Risks and benefits discussed including failed block, incomplete  Pain control, post dural puncture headache, nerve damage, paralysis, blood pressure Changes, nausea, vomiting, reactions to medications-both toxic and allergic and post Partum back pain. All questions were answered. Patient expressed understanding and wished to proceed. Sterile technique was used throughout procedure. Epidural site was Dressed with sterile barrier dressing. No paresthesias, signs of intravascular injection Or signs of intrathecal spread were encountered.  Patient was more comfortable after the epidural was dosed. Please see RN's note for documentation of vital signs and FHR which are stable.

## 2012-05-15 NOTE — Anesthesia Preprocedure Evaluation (Signed)
Anesthesia Evaluation  Patient identified by MRN, date of birth, ID band Patient awake    Reviewed: Allergy & Precautions, H&P , Patient's Chart, lab work & pertinent test results  Airway Mallampati: III TM Distance: >3 FB Neck ROM: full    Dental No notable dental hx. (+) Teeth Intact   Pulmonary Current Smoker,  breath sounds clear to auscultation  Pulmonary exam normal       Cardiovascular negative cardio ROS  Rhythm:regular Rate:Normal     Neuro/Psych negative neurological ROS  negative psych ROS   GI/Hepatic negative GI ROS, (+) Hepatitis -, CAsymptomatic, discovered during pregnancy   Endo/Other  negative endocrine ROS  Renal/GU negative Renal ROS  negative genitourinary   Musculoskeletal negative musculoskeletal ROS (+)   Abdominal Normal abdominal exam  (+)   Peds  Hematology negative hematology ROS (+)   Anesthesia Other Findings   Reproductive/Obstetrics (+) Pregnancy                           Anesthesia Physical Anesthesia Plan  ASA: II  Anesthesia Plan: Epidural   Post-op Pain Management:    Induction:   Airway Management Planned:   Additional Equipment:   Intra-op Plan:   Post-operative Plan:   Informed Consent: I have reviewed the patients History and Physical, chart, labs and discussed the procedure including the risks, benefits and alternatives for the proposed anesthesia with the patient or authorized representative who has indicated his/her understanding and acceptance.     Plan Discussed with: Anesthesiologist  Anesthesia Plan Comments:         Anesthesia Quick Evaluation

## 2012-05-15 NOTE — H&P (Signed)
Kathy Payne is a 21 y.o. female, G2 P0010, EGA 41+ weeks with EDC 9-2 presenting for cervical ripening and induction.  Prenatal care complicated by the discovery the she is HCV +, 1:129 risk Trisomy 21 with nl u/s and no other testing, recent S<D with small AC, reactive NSTs.  Please see prenatal records for complete history.  She received one dose of Cytotec late last pm, did not receive a second dose due to some late decelerations.  Maternal Medical History:  Fetal activity: Perceived fetal activity is normal.      OB History    Grav Para Term Preterm Abortions TAB SAB Ect Mult Living   2 0 0 0 1 1 0 0 0 0      Past Medical History  Diagnosis Date  . Wegener's granulomatosis     has had plasma pheresis  . Abnormal Pap smear   Hepatitis C  Past Surgical History  Procedure Date  . Appendectomy   . Leep   . Lung biopsy    Family History: family history is not on file. Social History:  reports that she has been smoking Cigarettes.  She has been smoking about .5 packs per day. She has never used smokeless tobacco. She reports that she does not drink alcohol or use illicit drugs.   Prenatal Transfer Tool  Maternal Diabetes: No Genetic Screening: Abnormal:  Results: Elevated risk of Trisomy 21 Maternal Ultrasounds/Referrals: Abnormal:  Findings:   Other:SGA with small AC Fetal Ultrasounds or other Referrals:  None Maternal Substance Abuse:  Yes:  Type: Smoker Significant Maternal Medications:  None Significant Maternal Lab Results:  Lab values include: Group B Strep positive, Other: HCV Other Comments:  1:129 risk Trisomy 1, mom +HCV  Review of Systems  Respiratory: Negative.   Cardiovascular: Negative.     Dilation: 1 Effacement (%): 60 Station: -2 Exam by:: Dr. Jackelyn Knife Blood pressure 121/73, pulse 100, temperature 97.6 F (36.4 C), temperature source Oral, resp. rate 18, height 5\' 6"  (1.676 m), weight 93.441 kg (206 lb), last menstrual period 08/01/2011. Maternal  Exam:  Uterine Assessment: Contraction strength is moderate.  Contraction frequency is irregular.   Abdomen: Patient reports no abdominal tenderness. Estimated fetal weight is 6 lbs.   Fetal presentation: vertex  Introitus: Normal vulva. Normal vagina.  Pelvis: adequate for delivery.   Cervix: Cervix evaluated by digital exam.     Fetal Exam Fetal Monitor Review: Mode: ultrasound.   Baseline rate: 130.  Variability: moderate (6-25 bpm).   Pattern: accelerations present and no decelerations.    Fetal State Assessment: Category I - tracings are normal.     Physical Exam  Constitutional: She appears well-developed and well-nourished.  Cardiovascular: Normal rate, regular rhythm and normal heart sounds.   No murmur heard. Respiratory: Effort normal and breath sounds normal. No respiratory distress. She has no wheezes.  GI: Soft.       gravid    Prenatal labs: ABO, Rh: A/Positive/-- (03/12 0000) Antibody:  neg Rubella: Equivocal (03/12 0000) RPR: NON REACTIVE (09/09 2025)  HBsAg: Negative (03/12 0000)  HIV: Non-reactive (03/12 0000)  GBS: Positive (07/31 0000)  GCT:  55  Assessment/Plan: IUP at 41 weeks with SGA and small AC, +HCV, +GBS, elevated risk for Trisomy 21.  Admitted last pm for ripening, received one dose of Cytotec.  Exam improved this am, will start pitocin, continue PCN for +GBS.     Kathy Payne D 05/15/2012, 8:03 AM

## 2012-05-15 NOTE — Progress Notes (Signed)
Comfortable with epidural Afeb, VSS FHT- Cat II, mod variability, early decels, ctx q 3 min VE- 3/90/-1, vtx, AROM- clear Continue pitocin and monitor progress

## 2012-05-16 MED ORDER — INFLUENZA VIRUS VACC SPLIT PF IM SUSP
0.5000 mL | INTRAMUSCULAR | Status: AC
  Administered 2012-05-17: 0.5 mL via INTRAMUSCULAR
  Filled 2012-05-16: qty 0.5

## 2012-05-16 MED ORDER — MAGNESIUM HYDROXIDE 400 MG/5ML PO SUSP: 30.0000 mL | ORAL | Status: DC | PRN

## 2012-05-16 MED ORDER — OXYCODONE-ACETAMINOPHEN 5-325 MG PO TABS
1.0000 | ORAL_TABLET | ORAL | Status: DC | PRN
  Administered 2012-05-16 (×3): 1 via ORAL
  Filled 2012-05-16 (×3): qty 1

## 2012-05-16 MED ORDER — METHYLERGONOVINE MALEATE 0.2 MG/ML IJ SOLN: 0.2000 mg | INTRAMUSCULAR | Status: DC | PRN

## 2012-05-16 MED ORDER — SENNOSIDES-DOCUSATE SODIUM 8.6-50 MG PO TABS
2.0000 | ORAL_TABLET | Freq: Every day | ORAL | Status: DC
  Administered 2012-05-16: 2 via ORAL

## 2012-05-16 MED ORDER — TETANUS-DIPHTH-ACELL PERTUSSIS 5-2.5-18.5 LF-MCG/0.5 IM SUSP: 0.5000 mL | Freq: Once | INTRAMUSCULAR | Status: DC

## 2012-05-16 MED ORDER — DIBUCAINE 1 % RE OINT: 1.0000 "application " | TOPICAL_OINTMENT | RECTAL | Status: DC | PRN

## 2012-05-16 MED ORDER — PRENATAL MULTIVITAMIN CH
1.0000 | ORAL_TABLET | Freq: Every day | ORAL | Status: DC
  Administered 2012-05-16 – 2012-05-17 (×2): 1 via ORAL
  Filled 2012-05-16 (×2): qty 1

## 2012-05-16 MED ORDER — LANOLIN HYDROUS EX OINT: TOPICAL_OINTMENT | CUTANEOUS | Status: DC | PRN

## 2012-05-16 MED ORDER — ONDANSETRON HCL 4 MG/2ML IJ SOLN: 4.0000 mg | INTRAMUSCULAR | Status: DC | PRN

## 2012-05-16 MED ORDER — METHYLERGONOVINE MALEATE 0.2 MG PO TABS: 0.2000 mg | ORAL_TABLET | ORAL | Status: DC | PRN

## 2012-05-16 MED ORDER — DIPHENHYDRAMINE HCL 25 MG PO CAPS: 25.0000 mg | ORAL_CAPSULE | Freq: Four times a day (QID) | ORAL | Status: DC | PRN

## 2012-05-16 MED ORDER — MEASLES, MUMPS & RUBELLA VAC ~~LOC~~ INJ
0.5000 mL | INJECTION | Freq: Once | SUBCUTANEOUS | Status: AC
  Administered 2012-05-17: 0.5 mL via SUBCUTANEOUS
  Filled 2012-05-16 (×2): qty 0.5

## 2012-05-16 MED ORDER — WITCH HAZEL-GLYCERIN EX PADS: 1.0000 "application " | MEDICATED_PAD | CUTANEOUS | Status: DC | PRN

## 2012-05-16 MED ORDER — SIMETHICONE 80 MG PO CHEW: 80.0000 mg | CHEWABLE_TABLET | ORAL | Status: DC | PRN

## 2012-05-16 MED ORDER — IBUPROFEN 600 MG PO TABS
600.0000 mg | ORAL_TABLET | Freq: Four times a day (QID) | ORAL | Status: DC
  Administered 2012-05-16 – 2012-05-17 (×6): 600 mg via ORAL
  Filled 2012-05-16 (×6): qty 1

## 2012-05-16 MED ORDER — ZOLPIDEM TARTRATE 5 MG PO TABS: 5.0000 mg | ORAL_TABLET | Freq: Every evening | ORAL | Status: DC | PRN

## 2012-05-16 MED ORDER — BENZOCAINE-MENTHOL 20-0.5 % EX AERO: 1.0000 "application " | INHALATION_SPRAY | CUTANEOUS | Status: DC | PRN

## 2012-05-16 MED ORDER — ONDANSETRON HCL 4 MG PO TABS: 4.0000 mg | ORAL_TABLET | ORAL | Status: DC | PRN

## 2012-05-16 NOTE — Progress Notes (Signed)
UR chart review completed.  

## 2012-05-16 NOTE — Anesthesia Postprocedure Evaluation (Signed)
  Anesthesia Post Note  Patient: Kathy Payne  Procedure(s) Performed: * No procedures listed *  Anesthesia type: Epidural  Patient location: Mother/Baby  Post pain: Pain level controlled  Post assessment: Post-op Vital signs reviewed  Last Vitals:  Filed Vitals:   05/16/12 0400  BP: 101/61  Pulse: 63  Temp: 36.9 C  Resp: 18    Post vital signs: Reviewed  Level of consciousness:alert  Complications: No apparent anesthesia complications

## 2012-05-16 NOTE — Progress Notes (Signed)
PPD #1 No problems Afeb, VSS Fundus firm, NT at U-1 Continue routine postpartum care 

## 2012-05-17 MED ORDER — OXYCODONE-ACETAMINOPHEN 5-325 MG PO TABS: 1.0000 | ORAL_TABLET | ORAL | Status: AC | PRN

## 2012-05-17 MED ORDER — IBUPROFEN 600 MG PO TABS: 600.0000 mg | ORAL_TABLET | Freq: Four times a day (QID) | ORAL | Status: AC

## 2012-05-17 NOTE — Progress Notes (Signed)
PPD #2 No problems Afeb, VSS Fundus firm at U-2 D/c home

## 2012-05-17 NOTE — Discharge Summary (Signed)
Obstetric Discharge Summary Reason for Admission: induction of labor Prenatal Procedures: NST Intrapartum Procedures: spontaneous vaginal delivery Postpartum Procedures: none Complications-Operative and Postpartum: none Hemoglobin  Date Value Range Status  05/14/2012 11.1* 12.0 - 15.0 g/dL Final     HCT  Date Value Range Status  05/14/2012 31.4* 36.0 - 46.0 % Final    Physical Exam:  General: alert Lochia: appropriate Uterine Fundus: firm  Discharge Diagnoses: Term Pregnancy-delivered and Hepatitis C  Discharge Information: Date: 05/17/2012 Activity: pelvic rest Diet: routine Medications: Ibuprofen and Percocet Condition: stable Instructions: refer to practice specific booklet Discharge to: home Follow-up Information    Follow up with Aeris Hersman D, MD. Schedule an appointment as soon as possible for a visit in 6 weeks. (Call Monday to schedule circumcision)    Contact information:   [address] [place] [address] [phone]          Newborn Data: Live born female  Birth Weight: 5 lb 10 oz (2551 g) APGAR: 9, 9  Home with mother.  Kathy Payne D 05/17/2012, 7:49 AM

## 2012-05-17 NOTE — Progress Notes (Signed)
Sw referral received to assess history of IV drug use. Pt told Sw that she hasn't used in over 1 1/2 years ago. She did not name a specific drug of choice, as she stated, "it just had to be IV." She did not seek treatment, rather stopped on her on. She lives with her mother, Phyllis [address]. She is unsure if FOB, Zachary Kennedy, will be involved. Pt's boyfriend is not the FOB. She has supplies for the infant and appears appropriate. UDS & meconium samples were not collected.      

## 2014-07-07 ENCOUNTER — Encounter (HOSPITAL_COMMUNITY): Payer: Self-pay

## 2016-12-09 ENCOUNTER — Encounter (HOSPITAL_COMMUNITY): Payer: Self-pay | Admitting: Emergency Medicine

## 2016-12-09 ENCOUNTER — Emergency Department (HOSPITAL_COMMUNITY)
Admission: EM | Admit: 2016-12-09 | Discharge: 2016-12-09 | Disposition: A | Discharge status: Home / Self Care | Payer: Medicaid Other | Attending: Emergency Medicine | Admitting: Emergency Medicine

## 2016-12-09 DIAGNOSIS — F17210 Nicotine dependence, cigarettes, uncomplicated: Secondary | ICD-10-CM | POA: Diagnosis not present

## 2016-12-09 DIAGNOSIS — L02416 Cutaneous abscess of left lower limb: Secondary | ICD-10-CM | POA: Diagnosis present

## 2016-12-09 MED ORDER — DICLOFENAC SODIUM 50 MG PO TBEC: 50.0000 mg | DELAYED_RELEASE_TABLET | Freq: Two times a day (BID) | ORAL | 0 refills | Status: AC

## 2016-12-09 MED ORDER — DOXYCYCLINE HYCLATE 100 MG PO CAPS: 100.0000 mg | ORAL_CAPSULE | Freq: Two times a day (BID) | ORAL | 0 refills | Status: AC

## 2016-12-09 NOTE — ED Notes (Signed)
Pt verbalized understanding discharge instructions and denies any further needs or questions at this time. VS stable, ambulatory and steady gait.   

## 2016-12-09 NOTE — ED Provider Notes (Signed)
[facility]-EMERGENCY DEPT Provider Note   CSN: [id number] Arrival date & time: 12/09/16  1706  By signing my name below, I, Cynda Acres, attest that this documentation has been prepared under the direction and in the presence of Kerrie Buffalo, NP. Electronically Signed: Cynda Acres, Scribe. 12/09/16. 7:24 PM.  History   Chief Complaint Chief Complaint  Patient presents with  . Abscess   HPI Comments: Kathy Payne is a 26 y.o. female with no pertinent medical history, who presents to the Emergency Department complaining of an "abscess" to the left knee that appeared 3-4 days ago. Patient states the "abscess' has continued to increase in size. Patient reports going to urgent care yesterday, where the patient was diagnosed with an abscess. Patient was advised to come here but she went home and has come today for treatment. She was not started on medication yesterday because they thought she was coming to the ED. Patient reports associated drainage and erythema. Patient reports applying neosporin to the area with no relief. No allergies or chance of pregnancy. Patient denies any fever, nausea, vomiting, abdominal pain, or any other symptoms.   The history is provided by the patient. No language interpreter was used.    Past Medical History:  Diagnosis Date  . Abnormal Pap smear   . Hepatitis C   . Wegener's granulomatosis (HCC)    has had plasma pheresis    Patient Active Problem List   Diagnosis Date Noted  . Hepatitis C 05/15/2012    Past Surgical History:  Procedure Laterality Date  . APPENDECTOMY    . LEEP    . LUNG BIOPSY      OB History    Gravida Para Term Preterm AB Living   0 1 1   SAB TAB Ectopic Multiple Live Births   0 1 0 0 1       Home Medications    Prior to Admission medications   Medication Sig Start Date End Date Taking? Authorizing Provider  diclofenac (VOLTAREN) 50 MG EC tablet Take 1 tablet (50 mg total) by mouth 2 (two) times daily. 12/09/16   Hope  Orlene Och, NP  doxycycline (VIBRAMYCIN) 100 MG capsule Take 1 capsule (100 mg total) by mouth 2 (two) times daily. 12/09/16   [name], NP  Prenatal Vit-Fe Fumarate-FA (PRENATAL MULTIVITAMIN) TABS Take 1 tablet by mouth daily.    Historical Provider, MD    Family History History reviewed. No pertinent family history.  Social History Social History  Substance Use Topics  . Smoking status: Current Every Day Smoker    Packs/day: 0.50    Types: Cigarettes  . Smokeless tobacco: Never Used  . Alcohol use No     Allergies   Patient has no known allergies.   Review of Systems Review of Systems  Constitutional: Negative for fever.  Gastrointestinal: Negative for abdominal pain, nausea and vomiting.  Musculoskeletal: Positive for arthralgias.       Left knee pain  Skin: Positive for color change and wound.       Abscess to the left knee.   Neurological: Negative for headaches.  Psychiatric/Behavioral: Negative for confusion.     Physical Exam Updated Vital Signs BP 118/77 (BP Location: Left Arm)   Pulse 70   Temp 98.4 F (36.9 C) (Oral)   Resp 18   SpO2 100%   Physical Exam  Constitutional: She appears well-developed and well-nourished. No distress.  Eyes: EOM are normal.  Neck: Normal range of  motion. Neck supple.  Cardiovascular: Normal rate.   Pulmonary/Chest: Effort normal.  Musculoskeletal: Normal range of motion.       Left knee: She exhibits erythema. She exhibits normal range of motion and no ecchymosis. Swelling: minimal. Tenderness found.       Legs: 4cm red raised area to the left anterior knee with a centralized dark center that is open with minimal drainage. The area is tender to palpation. No red streaking. Pedal pulse 2+  Neurological: She is alert.  Skin: Skin is warm and dry.  Psychiatric: She has a normal mood and affect. Her behavior is normal.  Nursing note and vitals reviewed.    ED Treatments / Results  DIAGNOSTIC STUDIES: Oxygen Saturation  is 98% on RA, normal by my interpretation.    COORDINATION OF CARE: 7:23 PM Discussed treatment plan with pt at bedside and pt agreed to plan, which includes doxycycline.   Labs (all labs ordered are listed, but only abnormal results are displayed) Labs Reviewed - No data to display  Radiology No results found.  Procedures Procedures (including critical care time)  Medications Ordered in ED Medications - No data to display   Initial Impression / Assessment and Plan / ED Course  I have reviewed the triage vital signs and the nursing notes.   Patient was advised to return to the emergency department if the area worsens.   Final Clinical Impressions(s) / ED Diagnoses  26 y.o. female with tender raised area to the left knee stable for d/c without red streaking, fever and area is draining. Will treat with antibiotics and she will return in 24 to 48 hours for wound check. She will return sooner for worsening symptoms.  Final diagnoses:  Abscess of left knee    New Prescriptions Discharge Medication List as of 12/09/2016  7:27 PM    START taking these medications   Details  diclofenac (VOLTAREN) 50 MG EC tablet Take 1 tablet (50 mg total) by mouth 2 (two) times daily., Starting Fri 12/09/2016, Print    doxycycline (VIBRAMYCIN) 100 MG capsule Take 1 capsule (100 mg total) by mouth 2 (two) times daily., Starting Fri 12/09/2016, Print      I personally performed the services described in this documentation, which was scribed in my presence. The recorded information has been reviewed and is accurate.    [address] [place], [place] 12/10/16 0009    Bethann Berkshire, MD 12/10/16 [phone]

## 2016-12-09 NOTE — Discharge Instructions (Signed)
Return in 2 days for wound recheck or sooner if symptoms worsen.

## 2016-12-09 NOTE — ED Triage Notes (Signed)
Pt here for abscess to left knee that is draining

## 2019-01-17 ENCOUNTER — Ambulatory Visit: Payer: Self-pay | Admitting: Sports Medicine

## 2019-01-21 ENCOUNTER — Encounter: Payer: Self-pay | Admitting: Podiatry

## 2019-01-21 ENCOUNTER — Other Ambulatory Visit: Payer: Self-pay

## 2019-01-21 ENCOUNTER — Ambulatory Visit: Payer: Medicaid Other | Admitting: Podiatry

## 2019-01-21 VITALS — BP 114/74 | HR 103 | Temp 97.8°F | Resp 19

## 2019-01-21 DIAGNOSIS — M7742 Metatarsalgia, left foot: Secondary | ICD-10-CM | POA: Diagnosis not present

## 2019-01-21 DIAGNOSIS — M21962 Unspecified acquired deformity of left lower leg: Secondary | ICD-10-CM | POA: Diagnosis not present

## 2019-01-21 NOTE — Progress Notes (Signed)
  Subjective:  Patient ID: Kathy Payne, female    DOB: [birth year],  MRN: [id number]  No chief complaint on file.   28 y.o. female presents with the above complaint. States that she has a callused area underneath the ball of her left foot, present for 2 years worse with walking. Has tried trimming it herself and over the counter callus remover.   Review of Systems: Negative except as noted in the HPI. Denies N/V/F/Ch.  Past Medical History:  Diagnosis Date  . Abnormal Pap smear   . Hepatitis C   . Wegener's granulomatosis (HCC)    has had plasma pheresis    Current Outpatient Medications:  .  amphetamine-dextroamphetamine (ADDERALL) 20 MG tablet, Adderall 20 mg tablet  Take 1 tablet twice a day by oral route., Disp: , Rfl:  .  levonorgestrel (MIRENA, 52 MG,) 20 MCG/24HR IUD, Mirena 20 mcg/24 hours (5 yrs) 52 mg intrauterine device  Take by intrauterine route., Disp: , Rfl:  .  clarithromycin (BIAXIN) 500 MG tablet, TK 1 T PO Q 12 H FOR 7 DAYS, Disp: , Rfl:  .  diclofenac (VOLTAREN) 50 MG EC tablet, Take 1 tablet (50 mg total) by mouth 2 (two) times daily. (Patient not taking: Reported on 01/21/2019), Disp: 15 tablet, Rfl: 0 .  doxycycline (VIBRAMYCIN) 100 MG capsule, Take 1 capsule (100 mg total) by mouth 2 (two) times daily. (Patient not taking: Reported on 01/21/2019), Disp: 14 capsule, Rfl: 0 .  FLUoxetine (PROZAC) 20 MG capsule, TK 1 C PO QD, Disp: , Rfl:  .  LINZESS 145 MCG CAPS capsule, TK 1 C PO QD, Disp: , Rfl:  .  pantoprazole (PROTONIX) 40 MG tablet, TK 1 T PO QD, Disp: , Rfl:  .  Prenatal Vit-Fe Fumarate-FA (PRENATAL MULTIVITAMIN) TABS, Take 1 tablet by mouth daily., Disp: , Rfl:  .  promethazine (PHENERGAN) 25 MG tablet, TK 1 T PO Q 6 H FOR 10 DAYS, Disp: , Rfl:  .  SUBOXONE 8-2 MG FILM, , Disp: , Rfl:   Social History   Tobacco Use  Smoking Status Current Every Day Smoker  . Packs/day: 0.50  . Types: Cigarettes  Smokeless Tobacco Never Used    No Known Allergies  Objective:   Vitals:   01/21/19 1558  BP: 114/74  Pulse: (!) 103  Resp: 19  Temp: 97.8 F (36.6 C)   There is no height or weight on file to calculate BMI. Constitutional Well developed. Well nourished.  Vascular Dorsalis pedis pulses palpable bilaterally. Posterior tibial pulses palpable bilaterally. Capillary refill normal to all digits.  No cyanosis or clubbing noted. Pedal hair growth normal.  Neurologic Normal speech. Oriented to person, place, and time. Epicritic sensation to light touch grossly present bilaterally.  Dermatologic Nails normal Skin HPKs bilateral feet under all metatarsal heads right, metatarsal heads 3/4 left.  Orthopedic: Normal joint ROM without pain or crepitus bilaterally. No visible deformities. No bony tenderness.   Radiographs: None Assessment:   1. Metatarsalgia, left foot   2. Metatarsal deformity, left    Plan:  Patient was evaluated and treated and all questions answered.  Metatarsal deformity left, callusfoot -Offered callus debridement, advised of non-coverage, declined. -Discussed use of good supportive shoes -Discussed use of urea cream -Discussed self care.  No follow-ups on file.
